# Patient Record
Sex: Female | Born: 1990 | Race: Black or African American | Hispanic: No | Marital: Single
Health system: Southern US, Community
[De-identification: ages and names within clinical notes are randomized; demographics above are authoritative.]

## PROBLEM LIST (undated history)

## (undated) ENCOUNTER — Emergency Department (HOSPITAL_COMMUNITY): Payer: BLUE CROSS/BLUE SHIELD | Source: Home / Self Care

---

## 2021-08-01 ENCOUNTER — Encounter (HOSPITAL_COMMUNITY): Payer: Self-pay

## 2021-08-01 ENCOUNTER — Emergency Department (HOSPITAL_COMMUNITY)
Admission: EM | Admit: 2021-08-01 | Discharge: 2021-08-01 | Disposition: A | Discharge status: Home / Self Care | Payer: Medicare Other | Attending: Emergency Medicine | Admitting: Emergency Medicine

## 2021-08-01 ENCOUNTER — Emergency Department (HOSPITAL_COMMUNITY): Payer: Medicare Other

## 2021-08-01 DIAGNOSIS — Z91040 Latex allergy status: Secondary | ICD-10-CM | POA: Diagnosis not present

## 2021-08-01 DIAGNOSIS — N9489 Other specified conditions associated with female genital organs and menstrual cycle: Secondary | ICD-10-CM | POA: Diagnosis not present

## 2021-08-01 DIAGNOSIS — F319 Bipolar disorder, unspecified: Secondary | ICD-10-CM | POA: Diagnosis not present

## 2021-08-01 DIAGNOSIS — R0602 Shortness of breath: Secondary | ICD-10-CM | POA: Diagnosis present

## 2021-08-01 DIAGNOSIS — E669 Obesity, unspecified: Secondary | ICD-10-CM | POA: Insufficient documentation

## 2021-08-01 DIAGNOSIS — R531 Weakness: Secondary | ICD-10-CM | POA: Insufficient documentation

## 2021-08-01 DIAGNOSIS — Z20822 Contact with and (suspected) exposure to covid-19: Secondary | ICD-10-CM | POA: Insufficient documentation

## 2021-08-01 DIAGNOSIS — F419 Anxiety disorder, unspecified: Secondary | ICD-10-CM

## 2021-08-01 DIAGNOSIS — M791 Myalgia, unspecified site: Secondary | ICD-10-CM | POA: Insufficient documentation

## 2021-08-01 DIAGNOSIS — R079 Chest pain, unspecified: Secondary | ICD-10-CM | POA: Diagnosis present

## 2021-08-01 LAB — RESP PANEL BY RT-PCR (FLU A&B, COVID) ARPGX2
Influenza A by PCR: NEGATIVE
Influenza B by PCR: NEGATIVE
SARS Coronavirus 2 by RT PCR: NEGATIVE

## 2021-08-01 LAB — BASIC METABOLIC PANEL
Anion gap: 10 (ref 5–15)
BUN: 6 mg/dL (ref 6–20)
CO2: 22 mmol/L (ref 22–32)
Calcium: 8.6 mg/dL — ABNORMAL LOW (ref 8.9–10.3)
Chloride: 104 mmol/L (ref 98–111)
Creatinine, Ser: 0.75 mg/dL (ref 0.44–1.00)
GFR, Estimated: >60 mL/min (ref >60)
Glucose, Bld: 103 mg/dL — ABNORMAL HIGH (ref 70–99)
Potassium: 3.2 mmol/L — ABNORMAL LOW (ref 3.5–5.1)
Sodium: 136 mmol/L (ref 135–145)

## 2021-08-01 LAB — CBC
HCT: 41.3 % (ref 36.0–46.0)
Hemoglobin: 13.6 g/dL (ref 12.0–15.0)
MCH: 31.9 pg (ref 26.0–34.0)
MCHC: 32.9 g/dL (ref 30.0–36.0)
MCV: 96.9 fL (ref 80.0–100.0)
Platelets: 204 10*3/uL (ref 150–400)
RBC: 4.26 MIL/uL (ref 3.87–5.11)
RDW: 12.9 % (ref 11.5–15.5)
WBC: 6.6 10*3/uL (ref 4.0–10.5)
nRBC: 0 % (ref 0.0–0.2)

## 2021-08-01 LAB — I-STAT BETA HCG BLOOD, ED (MC, WL, AP ONLY): I-stat hCG, quantitative: <5 m[IU]/mL (ref <5)

## 2021-08-01 LAB — TROPONIN I (HIGH SENSITIVITY): Troponin I (High Sensitivity): 5 ng/L (ref <18)

## 2021-08-01 LAB — RAPID URINE DRUG SCREEN, HOSP PERFORMED
Amphetamines: NOT DETECTED
Barbiturates: NOT DETECTED
Benzodiazepines: NOT DETECTED
Cocaine: NOT DETECTED
Opiates: NOT DETECTED
Tetrahydrocannabinol: POSITIVE — AB

## 2021-08-01 LAB — SALICYLATE LEVEL: Salicylate Lvl: <7 mg/dL — ABNORMAL LOW (ref 7.0–30.0)

## 2021-08-01 LAB — ETHANOL: Alcohol, Ethyl (B): <10 mg/dL (ref <10)

## 2021-08-01 LAB — ACETAMINOPHEN LEVEL: Acetaminophen (Tylenol), Serum: <10 ug/mL — ABNORMAL LOW (ref 10–30)

## 2021-08-01 MED ORDER — ONDANSETRON 4 MG PO TBDP: 4.0000 mg | ORAL_TABLET | Freq: Once | ORAL | Status: DC

## 2021-08-01 NOTE — ED Provider Triage Note (Signed)
Emergency Medicine Provider Triage Evaluation Note ? ?Tracy Kent , a 31 y.o. female  was evaluated in triage.  Pt complains of chest pain that is been on and off for "a while" that has become persistent today.  Patient describes the pain as sharp and pressure, not associated with nausea, vomiting, diaphoresis, or worse with exertion.  She also endorses pain all over her body.  She reports that this has been going on for an unknown amount of time.  She endorses psychiatric concerns, sleep difficulties, taste changes.  Denies SI, HI, AVH. Also endorses Shob.  No history of asthma, COPD. ? ?Review of Systems  ?Positive: Pain all over, chest pain ?Negative: NVD ? ?Physical Exam  ?BP (!) 159/99   Pulse 81   Resp 13   LMP 07/07/2021   SpO2 100%  ?Gen:   Awake, odd affect, tangential speech ?Resp:  Normal effort  ?MSK:   Moves extremities without difficulty  ?Other:  Normal heart sounds, rate, rhythm ? ?Medical Decision Making  ?Medically screening exam initiated at 1:22 PM.  Appropriate orders placed.  Kelise Kuch was informed that the remainder of the evaluation will be completed by another provider, this initial triage assessment does not replace that evaluation, and the importance of remaining in the ED until their evaluation is complete. ? ?Workup initiated ?  ?Olene Floss, PA-C ?08/01/21 1324 ? ?

## 2021-08-01 NOTE — ED Triage Notes (Signed)
Pt arrived via POV, c/o chest pain, SOB, feeling "overall sick" denies SI or HI but requesting psych evaluation for increased stress and anxiety.  ?

## 2021-08-01 NOTE — ED Provider Notes (Signed)
?[facility] DEPT ?Provider Note ? ? ?CSN: [id number] ?Arrival date & time: 08/01/21  1225 ? ?  ? ?History ? ?Chief Complaint  ?Patient presents with  ? Chest Pain  ? Psychiatric Evaluation  ? ? ?Tracy Kent is a 31 y.o. female. ? ?Patient complains of hurting all over.  She also complains of being anxious.  Patient is not suicidal homicidal patient has a history of obesity and bipolar disease ? ?The history is provided by the patient and medical records. No language interpreter was used.  ?Weakness ?Severity:  Moderate ?Onset quality:  Sudden ?Timing:  Intermittent ?Progression:  Waxing and waning ?Chronicity:  New ?Context: not alcohol use   ?Relieved by:  Nothing ?Worsened by:  Nothing ?Associated symptoms: no abdominal pain, no chest pain, no cough, no diarrhea, no frequency, no headaches and no seizures   ? ?  ? ?Home Medications ?Prior to Admission medications   ?Not on File  ?   ? ?Allergies    ?Latex   ? ?Review of Systems   ?Review of Systems  ?Constitutional:  Negative for appetite change and fatigue.  ?HENT:  Negative for congestion, ear discharge and sinus pressure.   ?Eyes:  Negative for discharge.  ?Respiratory:  Negative for cough.   ?Cardiovascular:  Negative for chest pain.  ?Gastrointestinal:  Negative for abdominal pain and diarrhea.  ?Genitourinary:  Negative for frequency and hematuria.  ?Musculoskeletal:  Negative for back pain.  ?Skin:  Negative for rash.  ?Neurological:  Positive for weakness. Negative for seizures and headaches.  ?Psychiatric/Behavioral:  Negative for hallucinations.   ? ?Physical Exam ?Updated Vital Signs ?BP (!) 145/95 (BP Location: Right Arm)   Pulse 90   Temp 98.2 ?F (36.8 ?C) (Oral)   Resp 15   Ht 5\' 11"  (1.803 m)   Wt (!) 181.4 kg   LMP 07/07/2021   SpO2 100%   BMI 55.79 kg/m?  ?Physical Exam ?Vitals and nursing note reviewed.  ?Constitutional:   ?   Appearance: She is well-developed.  ?HENT:  ?   Head: Normocephalic.  ?   Nose:  Nose normal.  ?Eyes:  ?   General: No scleral icterus. ?   Conjunctiva/sclera: Conjunctivae normal.  ?Neck:  ?   Thyroid: No thyromegaly.  ?Cardiovascular:  ?   Rate and Rhythm: Normal rate and regular rhythm.  ?   Heart sounds: No murmur heard. ?  No friction rub. No gallop.  ?Pulmonary:  ?   Breath sounds: No stridor. No wheezing or rales.  ?Chest:  ?   Chest wall: No tenderness.  ?Abdominal:  ?   General: There is no distension.  ?   Tenderness: There is no abdominal tenderness. There is no rebound.  ?Musculoskeletal:     ?   General: Normal range of motion.  ?   Cervical back: Neck supple.  ?Lymphadenopathy:  ?   Cervical: No cervical adenopathy.  ?Skin: ?   Findings: No erythema or rash.  ?Neurological:  ?   Mental Status: She is alert and oriented to person, place, and time.  ?   Motor: No abnormal muscle tone.  ?   Coordination: Coordination normal.  ?Psychiatric:     ?   Behavior: Behavior normal.  ? ? ?ED Results / Procedures / Treatments   ?Labs ?(all labs ordered are listed, but only abnormal results are displayed) ?Labs Reviewed  ?BASIC METABOLIC PANEL - Abnormal; Notable for the following components:  ?    Result Value  ?  Potassium 3.2 (*)   ? Glucose, Bld 103 (*)   ? Calcium 8.6 (*)   ? All other components within normal limits  ?RAPID URINE DRUG SCREEN, HOSP PERFORMED - Abnormal; Notable for the following components:  ? Tetrahydrocannabinol POSITIVE (*)   ? All other components within normal limits  ?SALICYLATE LEVEL - Abnormal; Notable for the following components:  ? Salicylate Lvl [id number] (*)   ? All other components within normal limits  ?ACETAMINOPHEN LEVEL - Abnormal; Notable for the following components:  ? Acetaminophen (Tylenol), Serum <10 (*)   ? All other components within normal limits  ?RESP PANEL BY RT-PCR (FLU A&B, COVID) ARPGX2  ?CBC  ?ETHANOL  ?I-STAT BETA HCG BLOOD, ED ([facility], [facility], [facility] ONLY)  ?TROPONIN I (HIGH SENSITIVITY)  ? ? ?EKG ?EKG Interpretation ? ?Date/Time:  Friday August 01 2021  12:45:40 EST ?Ventricular Rate:  82 ?PR Interval:  106 ?QRS Duration: 87 ?QT Interval:  375 ?QTC Calculation: 438 ?R Axis:   44 ?Text Interpretation: Sinus rhythm Short PR interval Borderline Q waves in inferior leads Confirmed by Milton Ferguson [phone]) on 08/01/2021 4:52:16 PM ? ?Radiology ?DG Chest 2 View ? ?Result Date: 08/01/2021 ?CLINICAL DATA:  Chest pain, shortness of breath EXAM: CHEST - 2 VIEW COMPARISON:  None. FINDINGS: The heart size and mediastinal contours are within normal limits. Both lungs are clear. The visualized skeletal structures are unremarkable. IMPRESSION: No active cardiopulmonary disease. Electronically Signed   By: Elmer Picker M.D.   On: 08/01/2021 13:01   ? ?Procedures ?Procedures  ? ? ?Medications Ordered in ED ?Medications  ?ondansetron (ZOFRAN-ODT) disintegrating tablet 4 mg (4 mg Oral Not Given 08/01/21 1636)  ? ? ?ED Course/ Medical Decision Making/ A&P ?  ?                        ?Medical Decision Making ?Amount and/or Complexity of Data Reviewed ?Radiology: ordered. ? ?This patient presents to the ED for concern of myalgias and weakness, this involves an extensive number of treatment options, and is a complaint that carries with it a high risk of complications and morbidity.  The differential diagnosis includes UTI ? ? ?Co morbidities that complicate the patient evaluation ? ?Obesity ? ? ?Additional history obtained: ? ?Additional history obtained from patient ?External records from outside source obtained and reviewed including hospital record ? ? ?Lab Tests: ? ?I Ordered, and personally interpreted labs.  The pertinent results include: CBC and chemistries which showed mild decrease potassium ? ? ?Imaging Studies ordered: ?No imaging ? ?Cardiac Monitoring: ? ?The patient was maintained on a cardiac monitor.  I personally viewed and interpreted the cardiac monitored which showed an underlying rhythm of: Normal sinus rhythm ? ? ?Medicines ordered and prescription drug  management: ? ?I ordered medication including Zofran for nausea ?Reevaluation of the patient after these medicines showed that the patient improved ?I have reviewed the patients home medicines and have made adjustments as needed ? ? ?Test Considered: ? ?None ? ? ?Critical Interventions: ? ?None ? ? ?Consultations Obtained: ? ?No consult ?Problem List / ED Course: ? ?Myalgias ? ? ? ?Social Determinants of Health: ? ?Bipolar disease ? ? ? ? ? ? ?Patient with myalgias and anxiety.  She left AMA.  She left before they will discuss the results and plan with her ? ? ? ? ? ? ? ?Final Clinical Impression(s) / ED Diagnoses ?Final diagnoses:  ?None  ? ? ?Rx / DC Orders ?ED Discharge  Orders   ? ? None  ? ?  ? ? ?  ?Milton Ferguson, MD ?08/02/21 [phone] ? ?

## 2021-08-01 NOTE — ED Notes (Signed)
Patient called this Clinical research associate in the room stating she wanted to be discharge. Patient stated that it was taking too long for her psych evaluation. Estell Harpin, MD notified of patient decision. Patient proceeded to leave her room stating that sh has been here since 12 and no one has helped her. Patient was ambulatory. Estell Harpin, MD notified of patient leaving AMA. ?

## 2023-02-06 IMAGING — CR DG CHEST 2V
2 series · 2 of 2 positions shown · non-contrast
Comparison: None.

CLINICAL DATA: Chest pain, shortness of breath

EXAM:
CHEST - 2 VIEW

[w chest pa]
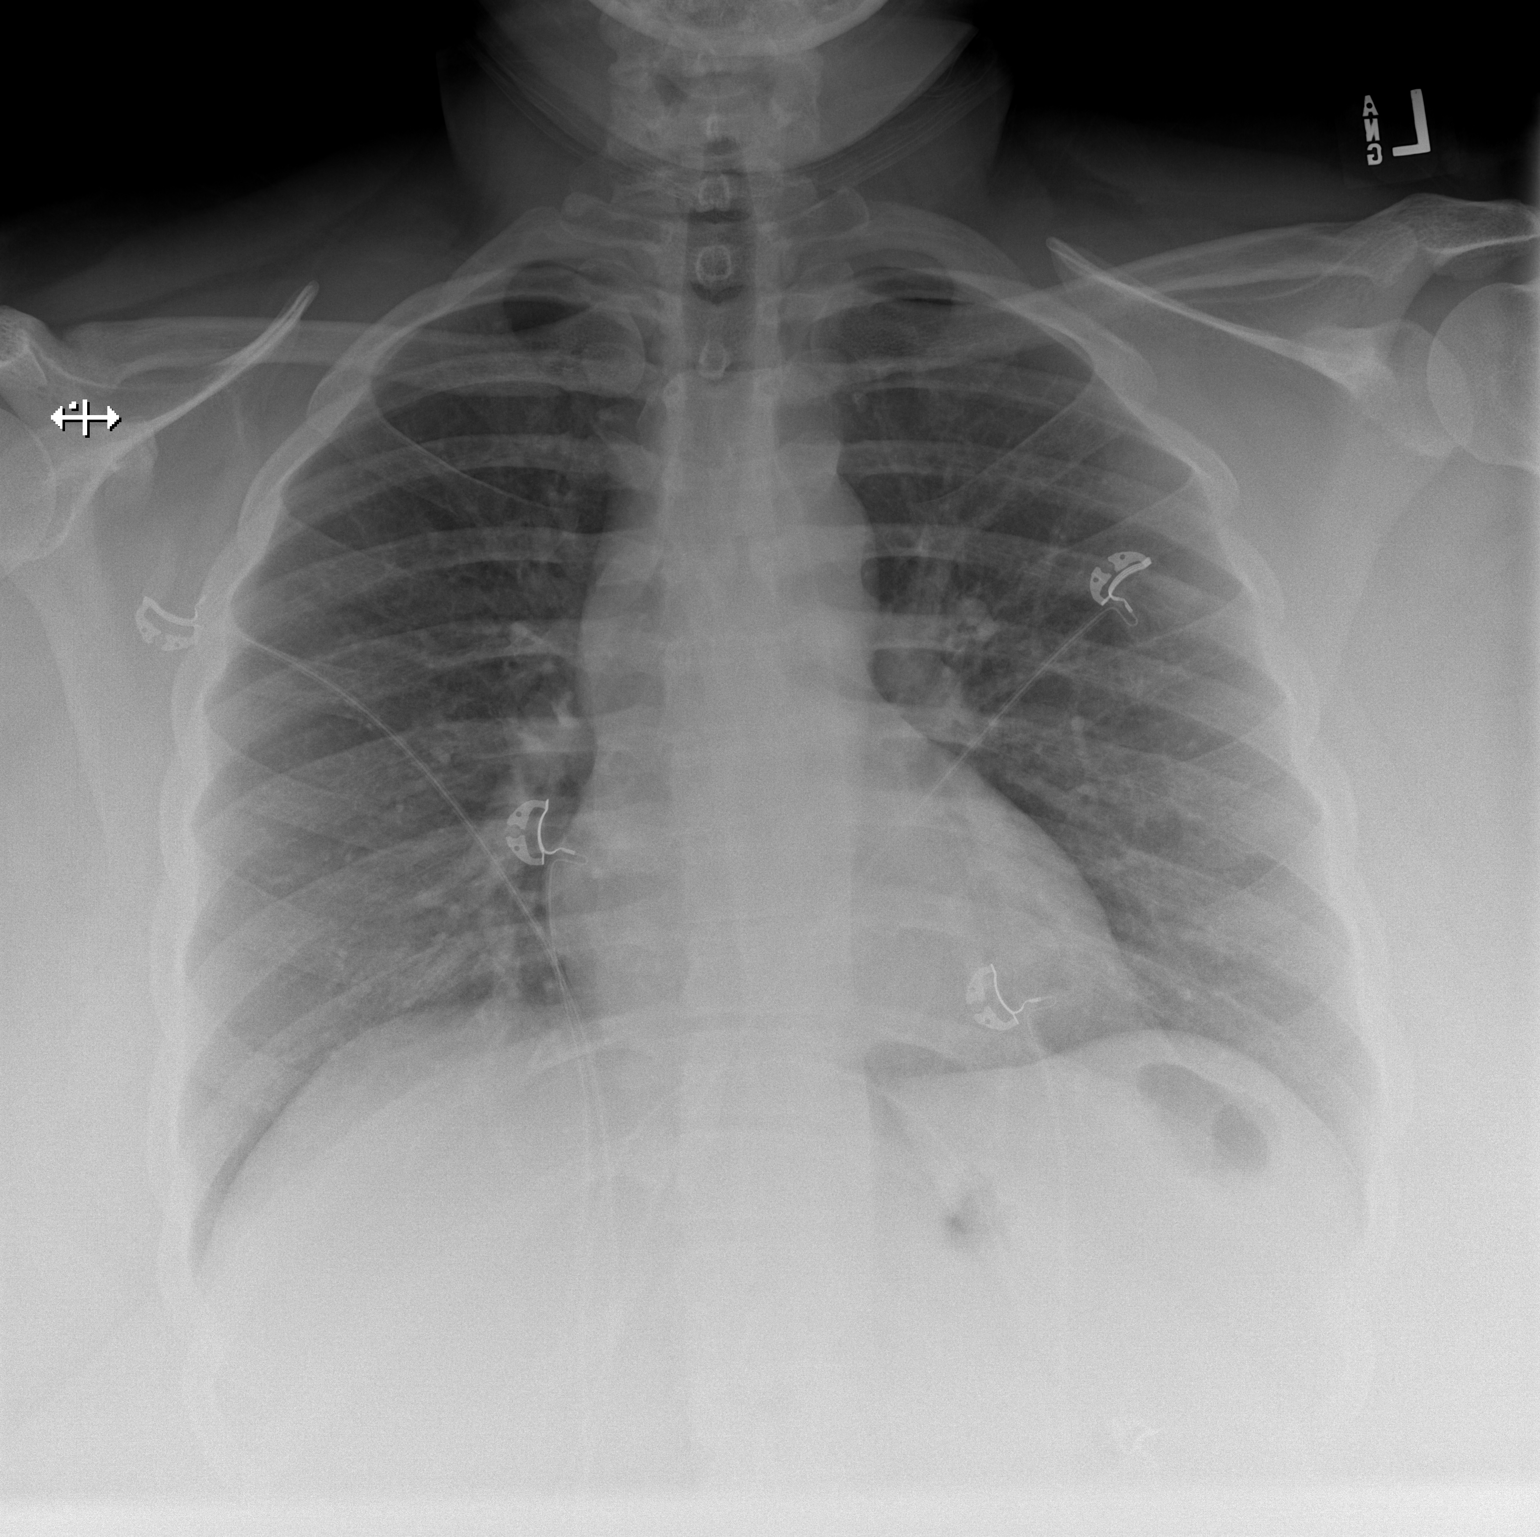

[w chest lat]
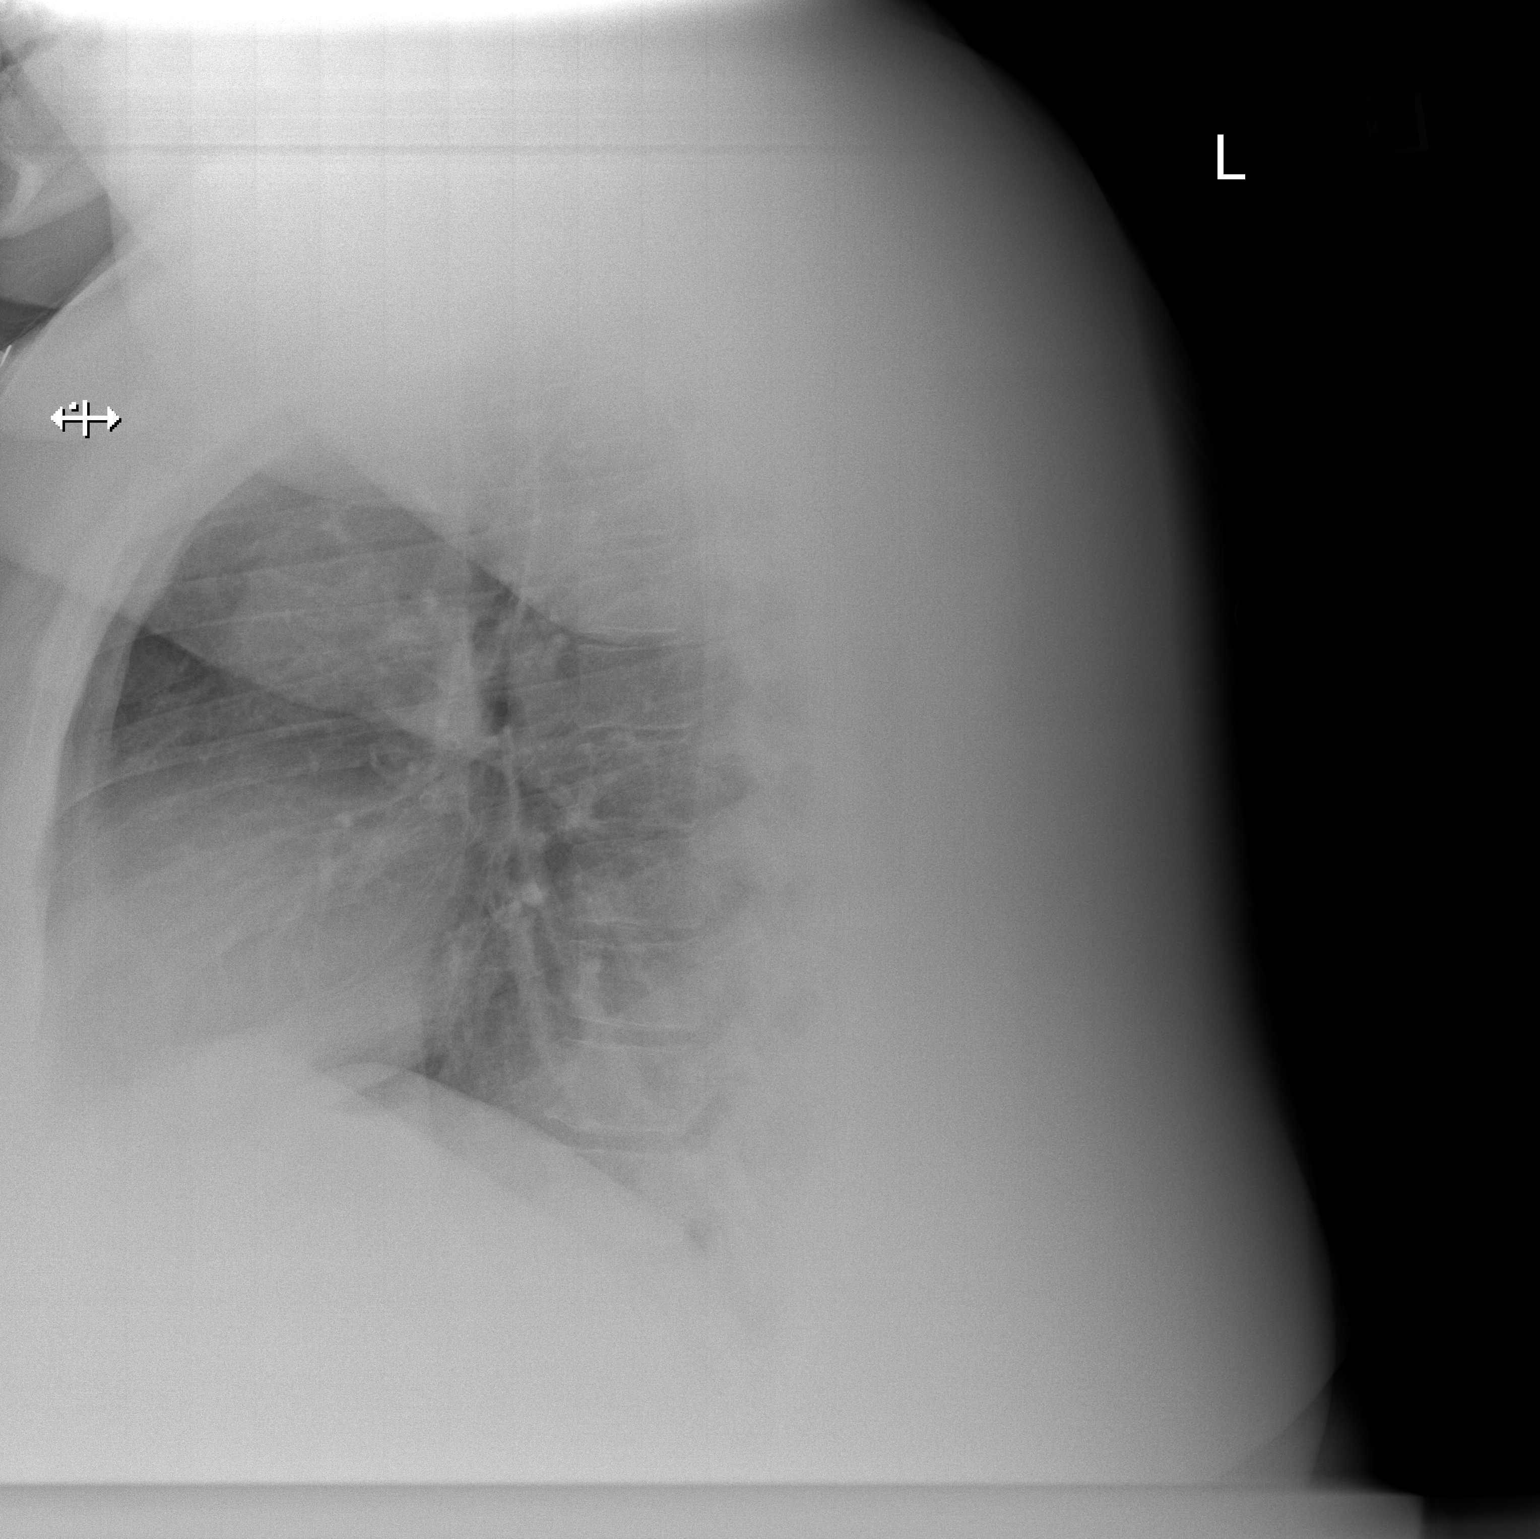

[2 of 2 positions shown; findings below may reference images not displayed]

FINDINGS: The heart size and mediastinal contours are within normal limits.
Both lungs are clear. The visualized skeletal structures are
unremarkable.
IMPRESSION: No active cardiopulmonary disease.
